# Patient Record
Sex: Male | Born: 1970
Health system: Southern US, Community
[De-identification: ages and names within clinical notes are randomized; demographics above are authoritative.]

## PROBLEM LIST (undated history)

## (undated) DIAGNOSIS — G459 Transient cerebral ischemic attack, unspecified: Secondary | ICD-10-CM

## (undated) DIAGNOSIS — E7800 Pure hypercholesterolemia, unspecified: Secondary | ICD-10-CM

## (undated) DIAGNOSIS — Z8669 Personal history of other diseases of the nervous system and sense organs: Secondary | ICD-10-CM

## (undated) DIAGNOSIS — Q2112 Patent foramen ovale: Secondary | ICD-10-CM

## (undated) DIAGNOSIS — Q211 Atrial septal defect: Secondary | ICD-10-CM

## (undated) HISTORY — DX: Personal history of other diseases of the nervous system and sense organs: Z86.69

## (undated) HISTORY — DX: Atrial septal defect: Q21.1

## (undated) HISTORY — DX: Patent foramen ovale: Q21.12

## (undated) HISTORY — DX: Pure hypercholesterolemia, unspecified: E78.00

## (undated) HISTORY — PX: OTHER SURGICAL HISTORY: SHX169

## (undated) HISTORY — DX: Transient cerebral ischemic attack, unspecified: G45.9

---

## 2002-01-27 ENCOUNTER — Encounter: Payer: Self-pay | Admitting: Emergency Medicine

## 2002-01-27 ENCOUNTER — Emergency Department (HOSPITAL_COMMUNITY): Admission: EM | Admit: 2002-01-27 | Discharge: 2002-01-27 | Payer: Self-pay | Admitting: Emergency Medicine

## 2009-08-22 DIAGNOSIS — G459 Transient cerebral ischemic attack, unspecified: Secondary | ICD-10-CM

## 2009-08-22 HISTORY — DX: Transient cerebral ischemic attack, unspecified: G45.9

## 2009-09-08 ENCOUNTER — Encounter (INDEPENDENT_AMBULATORY_CARE_PROVIDER_SITE_OTHER): Payer: Self-pay | Admitting: Internal Medicine

## 2009-09-08 ENCOUNTER — Inpatient Hospital Stay (HOSPITAL_COMMUNITY): Admission: EM | Admit: 2009-09-08 | Discharge: 2009-09-09 | Payer: Self-pay | Admitting: Emergency Medicine

## 2009-09-08 ENCOUNTER — Ambulatory Visit: Payer: Self-pay | Admitting: Vascular Surgery

## 2009-10-05 ENCOUNTER — Ambulatory Visit (HOSPITAL_COMMUNITY): Admission: RE | Admit: 2009-10-05 | Discharge: 2009-10-05 | Payer: Self-pay | Admitting: Interventional Cardiology

## 2009-10-05 ENCOUNTER — Encounter (INDEPENDENT_AMBULATORY_CARE_PROVIDER_SITE_OTHER): Payer: Self-pay | Admitting: Interventional Cardiology

## 2009-10-27 DIAGNOSIS — F329 Major depressive disorder, single episode, unspecified: Secondary | ICD-10-CM | POA: Insufficient documentation

## 2009-10-27 DIAGNOSIS — I2510 Atherosclerotic heart disease of native coronary artery without angina pectoris: Secondary | ICD-10-CM | POA: Insufficient documentation

## 2009-11-11 ENCOUNTER — Emergency Department (HOSPITAL_COMMUNITY): Admission: EM | Admit: 2009-11-11 | Discharge: 2009-11-11 | Payer: Self-pay | Admitting: Emergency Medicine

## 2009-11-21 ENCOUNTER — Ambulatory Visit: Payer: Self-pay | Admitting: Cardiovascular Disease

## 2009-11-21 DIAGNOSIS — Q211 Atrial septal defect: Secondary | ICD-10-CM | POA: Insufficient documentation

## 2009-11-25 ENCOUNTER — Telehealth: Payer: Self-pay | Admitting: Cardiovascular Disease

## 2009-12-13 ENCOUNTER — Encounter: Payer: Self-pay | Admitting: Cardiovascular Disease

## 2009-12-29 ENCOUNTER — Ambulatory Visit: Payer: Self-pay | Admitting: Cardiovascular Disease

## 2009-12-29 ENCOUNTER — Ambulatory Visit (HOSPITAL_COMMUNITY): Admission: RE | Admit: 2009-12-29 | Discharge: 2009-12-30 | Payer: Self-pay | Admitting: Cardiovascular Disease

## 2009-12-30 ENCOUNTER — Encounter: Payer: Self-pay | Admitting: Cardiovascular Disease

## 2010-02-02 ENCOUNTER — Encounter: Payer: Self-pay | Admitting: Cardiovascular Disease

## 2010-02-03 ENCOUNTER — Encounter: Payer: Self-pay | Admitting: Cardiovascular Disease

## 2010-02-03 ENCOUNTER — Ambulatory Visit: Payer: Self-pay | Admitting: Cardiovascular Disease

## 2010-02-03 ENCOUNTER — Ambulatory Visit: Payer: Self-pay

## 2010-02-03 ENCOUNTER — Ambulatory Visit (HOSPITAL_COMMUNITY): Admission: RE | Admit: 2010-02-03 | Discharge: 2010-02-03 | Payer: Self-pay | Admitting: Cardiovascular Disease

## 2010-04-19 ENCOUNTER — Encounter (INDEPENDENT_AMBULATORY_CARE_PROVIDER_SITE_OTHER): Payer: Self-pay | Admitting: *Deleted

## 2010-07-03 ENCOUNTER — Ambulatory Visit (HOSPITAL_COMMUNITY): Admission: RE | Admit: 2010-07-03 | Discharge: 2010-07-03 | Payer: Self-pay | Admitting: Cardiovascular Disease

## 2010-07-03 ENCOUNTER — Ambulatory Visit: Payer: Self-pay

## 2010-07-03 ENCOUNTER — Ambulatory Visit: Payer: Self-pay | Admitting: Cardiovascular Disease

## 2010-07-04 ENCOUNTER — Ambulatory Visit (HOSPITAL_BASED_OUTPATIENT_CLINIC_OR_DEPARTMENT_OTHER): Admission: RE | Admit: 2010-07-04 | Discharge: 2010-07-04 | Payer: Self-pay | Admitting: Otolaryngology

## 2010-07-08 ENCOUNTER — Ambulatory Visit: Payer: Self-pay | Admitting: Internal Medicine

## 2010-11-21 NOTE — Assessment & Plan Note (Signed)
Summary: rov   Visit Type:  follow up echo done today Primary Provider:  Duane Lope, MD  CC:  none.  History of Present Illness: 40 year-old male s/p transcatheter PFO closure March 2011 presents for followup evaluation today. He had an echo this afternoon showing normal LV function, no significant valvular disease, and normal position of his atrial septal occluder device. The patient feels well, remains active, and has no complaints. The patient denies chest pain, dyspnea, orthopnea, PND, edema, palpitations, lightheadedness, or syncope.   Current Medications (verified): 1)  Multivitamins  Tabs (Multiple Vitamin) .... Take 1 Tablet By Mouth Once A Day 2)  Plavix 75 Mg Tabs (Clopidogrel Bisulfate) .... Take One Tablet By Mouth Daily 3)  Crestor 20 Mg Tabs (Rosuvastatin Calcium) .... Take One Tablet By Mouth Daily. 4)  Niacin Cr 1000 Mg Cr-Tabs (Niacin) .... Take 1 Tablet By Mouth Once A Day 5)  Celexa 20 Mg Tabs (Citalopram Hydrobromide) .... Once Daily 6)  Aspirin 81 Mg Tbec (Aspirin) .... Take One Tablet By Mouth Daily  Allergies (verified): No Known Drug Allergies  Past History:  Past medical history reviewed for relevance to current acute and chronic problems.  Past Medical History: Current Problems:  TIA November 2010 with punctate cerebral infarct Migraine headaches Hypercholesterolemia PFO s/p transcatheter closure  Vital Signs:  Patient profile:   40 year old male Height:      72 inches Weight:      224 pounds BMI:     30.49 Pulse rate:   62 / minute Pulse rhythm:   regular Resp:     18 per minute BP sitting:   120 / 84  (left arm) Cuff size:   large  Vitals Entered By: Vikki Ports (July 03, 2010 4:09 PM)  Physical Exam  General:  Pt is alert and oriented, in no acute distress. HEENT: normal Neck: normal carotid upstrokes without bruits, JVP normal Lungs: CTA CV: RRR without murmur or gallop Abd: soft, NT, positive BS, no bruit, no  organomegaly Ext: no clubbing, cyanosis, or edema. peripheral pulses 2+ and equal Skin: warm and dry without rash    EKG  Procedure date:  07/03/2010  Findings:      NSR HR 62 bpm, WNL  Impression & Recommendations:  Problem # 1:  PATENT FORAMEN OVALE (ICD-745.5) Echo reviewed with patient and closure device is intact. He can discontinue plavix now that he is 6 months out from device placement. He also was instructed that he can discontinue SBE prophylaxis. Will f/u in one year with a 2D echo and followup office visit at that time.  Orders: EKG w/ Interpretation (93000)  Patient Instructions: 1)  Your physician recommends that you continue on your current medications as directed. Please refer to the Current Medication list given to you today. You may stop your Plavix when you finish your current bottle. 2)  Your physician wants you to follow-up in:  1 YEAR.  You will receive a reminder letter in the mail two months in advance. If you don't receive a letter, please call our office to schedule the follow-up appointment. 3)  Your physician has requested that you have an echocardiogram in 1 YEAR.  Echocardiography is a painless test that uses sound waves to create images of your heart. It provides your doctor with information about the size and shape of your heart and how well your heart's chambers and valves are working.  This procedure takes approximately one hour. There are no restrictions for this  procedure.

## 2010-11-21 NOTE — Progress Notes (Signed)
Summary: surgery  Phone Note Call from Patient Call back at Work Phone [phone]   Caller: Patient Reason for Call: Talk to Nurse Summary of Call: req call back form nurse, request not to do study would prefer to do surgery Initial call taken by: Migdalia Dk,  November 25, 2009 9:21 AM  Follow-up for Phone Call        The pt would like to try and have PFO closure the 1st or 2nd Week of March.  The pt is scheduled for a Bubble Study on 12/09/09 and he would like to know if he needs to proceed with this test.  I will ask Dr Excell Seltzer and call the pt on Monday. Julieta Gutting, RN, BSN  November 25, 2009 6:06 PM  Additional Follow-up for Phone Call Additional follow up Details #1::        I would go ahead and do the bubble study because we will repeat it post closure and it will be good to have a baseline for comparison. Additional Follow-up by: Norva Karvonen, MD,  November 29, 2009 5:36 PM

## 2010-11-21 NOTE — Miscellaneous (Signed)
Summary: Appointment Canceled  Appointment status changed to canceled by [employer] on 04/19/2010 2:34 PM.  Cancellation Comments --------------------- ECHO DX745.5/[employer] PENDING  Appointment Information ----------------------- Appt Type:  CARDIOLOGY ANCILLARY VISIT      Date:  Friday, July 07, 2010      Time:  2:00 PM for 60 min   Urgency:  Routine   Made By:  Pearson Grippe  To Visit:  [id number]    Reason:  ECHO DX745.5/[employer] PENDING  Appt Comments ------------- -- 04/19/10 14:34: ([facility]) CANCELED -- ECHO DX745.5/[employer] PENDING -- 02/03/10 15:30: ([facility]) BOOKED -- Routine CARDIOLOGY ANCILLARY VISIT at 07/07/2010 2:00 PM for 60 min ECHO DX745.5/[employer] PENDING                                                                                                   PT. RSC APPT. TO 07/03/10 @ 3:00

## 2010-11-21 NOTE — Miscellaneous (Signed)
Summary: Orders Update  Clinical Lists Changes  Orders: Added new Referral order of Echocardiogram (Echo) - Signed 

## 2010-11-21 NOTE — Assessment & Plan Note (Signed)
Summary: eph/jml   Visit Type:  Follow-up Primary Provider:  Duane Lope, MD  CC:  no complaints.  History of Present Illness: 40 year-old male presenting for followup after transcatheter PFO closure. He initially presented in November 2010 with left-sided numbness and tingling as well as transient left sided weakness. He underwent extensive evaluation with 2D echo, CT and MRI/MRA of the brain, and lab evaluatiion. The brain MRI showed a small infarction of the right parietal cortex. He underwent a TEE study in December 2010 demonstrating a PFO with right-to left shunt and mobile inter-atrial septum. He underwent transcatheter closure with an Amplatzer device in March 2011.  He denies chest pain, palpitations, dyspnea, or edema. He has had no neurologic symptoms. He denies headache.     Current Medications (verified): 1)  Multivitamins  Tabs (Multiple Vitamin) .... Take 1 Tablet By Mouth Once A Day 2)  Plavix 75 Mg Tabs (Clopidogrel Bisulfate) .... Take One Tablet By Mouth Daily 3)  Crestor 20 Mg Tabs (Rosuvastatin Calcium) .... Take One Tablet By Mouth Daily. 4)  Niacin Cr 1000 Mg Cr-Tabs (Niacin) .... Take 1 Tablet By Mouth Once A Day 5)  Celexa 20 Mg Tabs (Citalopram Hydrobromide) .... Once Daily 6)  Aspirin Ec 325 Mg Tbec (Aspirin) .... Take One Tablet By Mouth Daily  Allergies (verified): No Known Drug Allergies  Past History:  Past medical history reviewed for relevance to current acute and chronic problems.  Past Medical History: Current Problems:  TIA November 2010 with punctate cerebral infarct Migraine headaches Hypercholesterolemia PFO  Vital Signs:  Patient profile:   40 year old male Height:      72 inches Weight:      223 pounds BMI:     30.35 Pulse rate:   65 / minute BP sitting:   118 / 78  (left arm) Cuff size:   regular  Vitals Entered By: Hardin Negus, RMA (February 03, 2010 2:54 PM)  Physical Exam  General:  Pt is alert and oriented, in no acute  distress. HEENT: normal Neck: normal carotid upstrokes without bruits, JVP normal Lungs: CTA CV: RRR without murmur or gallop Abd: soft, NT, positive BS, no bruit, no organomegaly Ext: no clubbing, cyanosis, or edema. peripheral pulses 2+ and equal Skin: warm and dry without rash    Echocardiogram  Procedure date:  02/03/2010  Findings:      Left ventricle: The cavity size was normal. Wall thickness was       increased in a pattern of mild LVH. Systolic function was normal.       The estimated ejection fraction was in the range of 55% to 60%.       Features are consistent with a pseudonormal left ventricular       filling pattern, with concomitant abnormal relaxation and       increased filling pressure (grade 2 diastolic dysfunction).     - Aorta: Aortic root is minimally dilated at 40 mm.     - Mitral valve: Mild regurgitation.     - Left atrium: Poor acoustic windows. Difficult to see atrial       septum. Cannot confirm any residual PFO by this study. Would need       TEE.  Impression & Recommendations:  Problem # 1:  PATENT FORAMEN OVALE (ICD-745.5) The patient is doing well status post closure of his atrial septal communication. I reviewed his echo images today and it is difficult to visualize his atrial septum. Will repeat his echo  study in about 6 months to make sure there is no effusion or other abnormality at that time. Recommend that he continue dual antiplatelet therapy with aspirin and Plavix for a period of 6 months. He will maintain antiplatelet therapy with low-dose aspirin long term. We also discussed the need to use SBE prophylaxis for a total of 6 months as well.  Orders: Echocardiogram (Echo)  Patient Instructions: 1)  Your physician recommends that you schedule a follow-up appointment in: follow up visit in september and echocardiogram same day 2)  Your physician has requested that you have an echocardiogram.  Echocardiography is a painless test that uses  sound waves to create images of your heart. It provides your doctor with information about the size and shape of your heart and how well your heart's chambers and valves are working.  This procedure takes approximately one hour. There are no restrictions for this procedure.

## 2010-11-21 NOTE — Assessment & Plan Note (Signed)
Summary: PFO   Visit Type:  Initial Consult Primary Provider:  Duane Lope, MD  CC:  PFO.  History of Present Illness: 40 year-old male who initially presented in November 2010 with left-sided numbness and tingling as well as transient left sided weakness. He underwent extensive evaluation with 2D echo, CT and MRI/MRA of the brain, and lab evaluatiion. The brain MRI showed a small infarction of the right parietal cortex. He underwent a TEE study in December 2010 demonstrating a PFO with right-to left shunt and mobile inter-atrial septum. He is now referred for consideration of transcatheter closure.  The pt was evaluated in the Emergency Room again in January for chest pain and numbness, at which time a repeat brain MRI was negative. He was evaluated by Dr Anne Hahn at that time and discharged home after a short period of observation.  At present he feels well. He is having no chest pain, headache, palpitations, edema, or other complaints. He does complain of lightheadedness after walking.       Current Medications (verified): 1)  Multivitamins  Tabs (Multiple Vitamin) .... Take 1 Tablet By Mouth Once A Day 2)  Provigil 200 Mg Tabs (Modafinil) .... Take 1 Tablet By Mouth Once A Day 3)  Aggrenox 25-200 Mg Xr12h-Cap (Aspirin-Dipyridamole) .... Take 1 Capsule By Mouth Two Times A Day 4)  Crestor 20 Mg Tabs (Rosuvastatin Calcium) .... Take One Tablet By Mouth Daily. 5)  Niacin Cr 1000 Mg Cr-Tabs (Niacin) .... Take 1 Tablet By Mouth Once A Day  Allergies (verified): No Known Drug Allergies  Past History:  Past medical, surgical, family and social histories (including risk factors) reviewed, and no changes noted (except as noted below).  Past Medical History: Current Problems:  TIA November 2010 with punctate cerebral infarct Migraine headaches Hypercholesterolemia  Past Surgical History: VP shunt as a child, age 72 for concern of hydrocephalus causing headaches  Family  History: Reviewed history from 10/27/2009 and no changes required.  His brother had a stroke at age 13, diabetes poorly controlled Father had CABG at age 8, also had an MI.  Mother had PFO closure about 5 years ago  Social History: Reviewed history from 10/27/2009 and no changes required. He denies tobacco, he denies alcohol.  Married, works for [employer]  Review of Systems       History of migraine headaches, otherwise negative except as per HPI   Vital Signs:  Patient profile:   40 year old male Height:      72 inches Weight:      219 pounds BMI:     29.81 Pulse rate:   75 / minute Pulse rhythm:   regular Resp:     18 per minute BP sitting:   108 / 80  (left arm) Cuff size:   large  Vitals Entered By: Vikki Ports (November 21, 2009 9:49 AM)  Physical Exam  General:  Pt is well-developed, alert and oriented, age-appropriate male, in no acute distress HEENT: normal Neck: no thyromegaly           JVP normal, carotid upstrokes normal without bruits Lungs: CTA Chest: equal expansion  CV: Apical impulse nondisplaced, RRR without murmur or gallop Abd: soft, NT, positive BS, no HSM, no bruit Back: no CVA tenderness Ext: no clubbing, cyanosis, or edema        femoral pulses 2+ without bruits        pedal pulses 2+ and equal Skin: warm, dry, no rash Neuro: CNII-XII intact,strength 5/5 = b/l  EKG  Procedure date:  11/21/2009  Findings:      NSR, HR 75 bpm, within normal limits  Impression & Recommendations:  Problem # 1:  PATENT FORAMEN OVALE (ICD-745.5) I had a long discussion with the patient and his wife today regarding PFO and stroke. I reviewed the TEE images with the patient. Of note, his TIA occurred on a background of antiplatelet Rx with ASA. He is now on aggrenox with no intercurrent events. We discussed the lack of randomized controlled data for benefit of transcatheter closure, but that is frequently done for treatment of PFO and prevention of recurrent  TIA. I discussed the RESPECT PFO trial in detail with the patient, and he will meet with Dr Pearlean Brownie tomorrow for evaluation. In the meantime, I have recommended continuation of aggrenox and secondary risk reduction measures with aggressive cholesterol-lowering.   We reviewed risks, indications, and alternatives to transcatheter closure and discussed the details of the procedure. He will be in touch wiht me after he meets with Dr Pearlean Brownie and decides on whether to enroll in the Respect trial.   Orders: EKG w/ Interpretation (93000)  Patient Instructions: 1)  Your physician recommends that you schedule a follow-up appointment as needed.

## 2010-11-21 NOTE — Letter (Signed)
Summary: PFO Closure- Main Lab  [facility], Main Office  [address]   [place], [place] [address]   Phone: [phone]  Fax: [phone]     12/13/2009 MRN: [id number]  JAVON SNEE [address] [place], [place]  [address]  Dear Mr. HARTLAGE,   You are scheduled for a PFO Closure on Thursday December 29, 2009               with Dr. Excell Seltzer.  Please arrive at the [facility] at 5:30       a.m. on the day of your procedure.  1. DIET     _X__ Nothing to eat or drink after midnight except your medications with a sip of water.  2. Your last dose of Aggrenox will be on Monday (12/26/09).  Please start Plavix 300mg  and Aspirin 325mg  on (12/27/09).  On 12/28/09 you will decrease Plavix to 75mg  daily and continue Aspirin at 325mg  daily.  MAKE SURE YOU TAKE YOUR PLAVIX and ASPIRIN ON THE DAY OF YOUR PROCEDURE.  3. _X___ YOU MAY TAKE ALL of your remaining medications with a small amount of water.     4. Plan for one night stay - bring personal belongings (i.e. toothpaste, toothbrush, etc.)  5. Bring a current list of your medications and current insurance cards.  6. Must have a responsible person to drive you home.   7. Someone must be with you for the first 24 hours after you arrive home.  8. Please wear clothes that are easy to get on and off and wear slip-on shoes.  *Special note: Every effort is made to have your procedure done on time.  Occasionally there are emergencies that present themselves at the hospital that may cause delays.  Please be patient if a delay does occur.  If you have any questions after you get home, please call the office at the number listed above.  Julieta Gutting, RN, BSN

## 2011-01-07 LAB — CBC
Hemoglobin: 14.4 g/dL (ref 13.0–17.0)
RDW: 13.6 % (ref 11.5–15.5)
WBC: 4.8 10*3/uL (ref 4.0–10.5)

## 2011-01-07 LAB — BASIC METABOLIC PANEL
Calcium: 9.1 mg/dL (ref 8.4–10.5)
GFR calc Af Amer: >60 mL/min (ref >60)
GFR calc non Af Amer: >60 mL/min (ref >60)
Glucose, Bld: 106 mg/dL — ABNORMAL HIGH (ref 70–99)
Potassium: 4.1 mEq/L (ref 3.5–5.1)
Sodium: 137 mEq/L (ref 135–145)

## 2011-01-07 LAB — POCT CARDIAC MARKERS
CKMB, poc: <1 ng/mL — ABNORMAL LOW (ref 1.0–8.0)
Myoglobin, poc: 45.1 ng/mL (ref 12–200)

## 2011-01-07 LAB — DIFFERENTIAL
Basophils Absolute: 0 10*3/uL (ref 0.0–0.1)
Lymphocytes Relative: 21 % (ref 12–46)
Lymphs Abs: 1 10*3/uL (ref 0.7–4.0)
Monocytes Absolute: 0.3 10*3/uL (ref 0.1–1.0)
Neutro Abs: 3.3 10*3/uL (ref 1.7–7.7)

## 2011-01-07 LAB — PROTIME-INR: Prothrombin Time: 13 seconds (ref 11.6–15.2)

## 2011-01-14 LAB — PROTIME-INR
INR: 0.94 (ref 0.00–1.49)
Prothrombin Time: 12.5 seconds (ref 11.6–15.2)

## 2011-01-14 LAB — CBC
HCT: 43.3 % (ref 39.0–52.0)
Hemoglobin: 15.1 g/dL (ref 13.0–17.0)
MCHC: 34.8 g/dL (ref 30.0–36.0)
MCV: 90.5 fL (ref 78.0–100.0)
Platelets: 187 10*3/uL (ref 150–400)
RBC: 4.79 MIL/uL (ref 4.22–5.81)
RDW: 12.8 % (ref 11.5–15.5)
WBC: 5.1 10*3/uL (ref 4.0–10.5)

## 2011-01-14 LAB — BASIC METABOLIC PANEL
BUN: 10 mg/dL (ref 6–23)
CO2: 25 mEq/L (ref 19–32)
Calcium: 8.7 mg/dL (ref 8.4–10.5)
Chloride: 108 mEq/L (ref 96–112)
Creatinine, Ser: 0.73 mg/dL (ref 0.4–1.5)
GFR calc Af Amer: >60 mL/min (ref >60)
GFR calc non Af Amer: >60 mL/min (ref >60)
Glucose, Bld: 118 mg/dL — ABNORMAL HIGH (ref 70–99)
Potassium: 3.9 mEq/L (ref 3.5–5.1)
Sodium: 137 mEq/L (ref 135–145)

## 2011-01-14 LAB — APTT: aPTT: 31 seconds (ref 24–37)

## 2011-01-24 LAB — DIFFERENTIAL
Basophils Absolute: 0 10*3/uL (ref 0.0–0.1)
Basophils Relative: 0 % (ref 0–1)
Eosinophils Absolute: 0.1 10*3/uL (ref 0.0–0.7)
Eosinophils Absolute: 0.1 10*3/uL (ref 0.0–0.7)
Lymphs Abs: 1.1 10*3/uL (ref 0.7–4.0)
Monocytes Absolute: 0.7 10*3/uL (ref 0.1–1.0)
Monocytes Relative: 11 % (ref 3–12)
Monocytes Relative: 7 % (ref 3–12)
Neutrophils Relative %: 69 % (ref 43–77)
Neutrophils Relative %: 78 % — ABNORMAL HIGH (ref 43–77)

## 2011-01-24 LAB — COMPREHENSIVE METABOLIC PANEL
ALT: 34 U/L (ref 0–53)
AST: 34 U/L (ref 0–37)
Albumin: 4.1 g/dL (ref 3.5–5.2)
Calcium: 8.7 mg/dL (ref 8.4–10.5)
GFR calc Af Amer: >60 mL/min (ref >60)
Glucose, Bld: 102 mg/dL — ABNORMAL HIGH (ref 70–99)
Sodium: 137 mEq/L (ref 135–145)
Total Protein: 7 g/dL (ref 6.0–8.3)

## 2011-01-24 LAB — POCT CARDIAC MARKERS
CKMB, poc: <1 ng/mL — ABNORMAL LOW (ref 1.0–8.0)
Myoglobin, poc: 64.8 ng/mL (ref 12–200)
Myoglobin, poc: 84.3 ng/mL (ref 12–200)
Troponin i, poc: <0.05 ng/mL (ref 0.00–0.09)
Troponin i, poc: <0.05 ng/mL (ref 0.00–0.09)

## 2011-01-24 LAB — BASIC METABOLIC PANEL
BUN: 9 mg/dL (ref 6–23)
CO2: 27 mEq/L (ref 19–32)
Calcium: 8.9 mg/dL (ref 8.4–10.5)
Creatinine, Ser: 0.93 mg/dL (ref 0.4–1.5)
Glucose, Bld: 105 mg/dL — ABNORMAL HIGH (ref 70–99)

## 2011-01-24 LAB — CBC
MCHC: 34.8 g/dL (ref 30.0–36.0)
MCHC: 35 g/dL (ref 30.0–36.0)
MCV: 88.7 fL (ref 78.0–100.0)
Platelets: 269 10*3/uL (ref 150–400)
RBC: 4.85 MIL/uL (ref 4.22–5.81)
RDW: 12.4 % (ref 11.5–15.5)
RDW: 12.5 % (ref 11.5–15.5)

## 2011-01-24 LAB — POCT I-STAT, CHEM 8
BUN: 8 mg/dL (ref 6–23)
Calcium, Ion: 1.21 mmol/L (ref 1.12–1.32)
Chloride: 103 mEq/L (ref 96–112)

## 2011-01-24 LAB — PROTIME-INR: INR: 1 (ref 0.00–1.49)

## 2011-01-24 LAB — LIPID PANEL
LDL Cholesterol: 116 mg/dL — ABNORMAL HIGH (ref 0–99)
VLDL: 46 mg/dL — ABNORMAL HIGH (ref 0–40)

## 2011-01-24 LAB — RAPID URINE DRUG SCREEN, HOSP PERFORMED
Barbiturates: NOT DETECTED
Benzodiazepines: NOT DETECTED

## 2011-01-24 LAB — MAGNESIUM: Magnesium: 2.3 mg/dL (ref 1.5–2.5)

## 2011-01-24 LAB — HEMOGLOBIN A1C
Hgb A1c MFr Bld: 5.8 % (ref 4.6–6.1)
Mean Plasma Glucose: 120 mg/dL

## 2011-06-04 ENCOUNTER — Encounter: Payer: Self-pay | Admitting: Cardiovascular Disease

## 2011-06-28 ENCOUNTER — Encounter: Payer: Self-pay | Admitting: Cardiovascular Disease

## 2011-06-28 ENCOUNTER — Ambulatory Visit (INDEPENDENT_AMBULATORY_CARE_PROVIDER_SITE_OTHER): Payer: BC Managed Care – PPO | Admitting: Cardiovascular Disease

## 2011-06-28 VITALS — BP 122/86 | HR 68 | Ht 72.0 in | Wt 240.4 lb

## 2011-06-28 DIAGNOSIS — Q2111 Secundum atrial septal defect: Secondary | ICD-10-CM

## 2011-06-28 DIAGNOSIS — Q211 Atrial septal defect: Secondary | ICD-10-CM

## 2011-06-28 NOTE — Patient Instructions (Signed)

## 2011-07-02 ENCOUNTER — Encounter: Payer: Self-pay | Admitting: Cardiovascular Disease

## 2011-07-02 NOTE — Progress Notes (Signed)
HPI:  This is a 40 year old gentleman presenting for followup evaluation. He initially presented with a TIA in 2010 and was ultimately diagnosed with a patent foramen ovale with right-to-left intracardiac shunt. He underwent transcatheter closure in March 2011. The patient has had no subsequent problems. He denies chest pain, dyspnea, edema, orthopnea, or PND. He denies recurrent neurologic symptoms. He's had no headaches.  Outpatient Encounter Prescriptions as of 06/28/2011  Medication Sig Dispense Refill  . aspirin 81 MG tablet Take 81 mg by mouth daily.        . citalopram (CELEXA) 20 MG tablet Take 20 mg by mouth daily.        . Multiple Vitamin (MULTIVITAMIN) capsule Take 1 capsule by mouth daily.        . Niacin CR 1000 MG TBCR Take 1 tablet by mouth daily.        . rosuvastatin (CRESTOR) 20 MG tablet Take 20 mg by mouth daily.        Marland Kitchen DISCONTD: clopidogrel (PLAVIX) 75 MG tablet Take 75 mg by mouth daily.          No Known Allergies  Past Medical History  Diagnosis Date  . TIA (transient ischemic attack) 08/2009    with punctate cerebral infarct  . History of migraine headaches   . Hypercholesteremia   . PFO (patent foramen ovale)     s/p transcatheter closure    ROS: Negative except as per HPI  BP 122/86  Pulse 68  Ht 6' (1.829 m)  Wt 240 lb 6.4 oz (109.045 kg)  BMI 32.60 kg/m2  PHYSICAL EXAM: Pt is alert and oriented, overweight male in NAD HEENT: normal Neck: JVP - normal, carotids 2+= without bruits Lungs: CTA bilaterally CV: RRR without murmur or gallop Abd: soft, NT, Positive BS, no hepatomegaly Ext: no C/C/E, distal pulses intact and equal Skin: warm/dry no rash  EKG:  Normal sinus rhythm 68 beats per minute, within normal limits.  ASSESSMENT AND PLAN:

## 2011-07-03 ENCOUNTER — Ambulatory Visit (HOSPITAL_COMMUNITY): Payer: BC Managed Care – PPO | Attending: Cardiovascular Disease | Admitting: Radiology

## 2011-07-03 ENCOUNTER — Encounter: Payer: Self-pay | Admitting: *Deleted

## 2011-07-03 DIAGNOSIS — Q2111 Secundum atrial septal defect: Secondary | ICD-10-CM | POA: Insufficient documentation

## 2011-07-03 DIAGNOSIS — I079 Rheumatic tricuspid valve disease, unspecified: Secondary | ICD-10-CM | POA: Insufficient documentation

## 2011-07-03 DIAGNOSIS — E669 Obesity, unspecified: Secondary | ICD-10-CM | POA: Insufficient documentation

## 2011-07-03 DIAGNOSIS — I2510 Atherosclerotic heart disease of native coronary artery without angina pectoris: Secondary | ICD-10-CM | POA: Insufficient documentation

## 2011-07-03 DIAGNOSIS — Q211 Atrial septal defect: Secondary | ICD-10-CM

## 2011-07-03 DIAGNOSIS — I059 Rheumatic mitral valve disease, unspecified: Secondary | ICD-10-CM | POA: Insufficient documentation

## 2011-07-03 NOTE — Assessment & Plan Note (Signed)
The patient is stable from a cardiovascular perspective. He remains in sinus rhythm. He is due for a followup echocardiogram as it has been one year since his last echo study. We discussed the importance of primary risk reduction measures with lifestyle changes including focus on diet and exercise.

## 2012-07-21 ENCOUNTER — Ambulatory Visit: Payer: BC Managed Care – PPO | Admitting: Cardiovascular Disease

## 2012-08-22 ENCOUNTER — Encounter: Payer: Self-pay | Admitting: Cardiovascular Disease

## 2012-08-22 ENCOUNTER — Ambulatory Visit (INDEPENDENT_AMBULATORY_CARE_PROVIDER_SITE_OTHER): Payer: BC Managed Care – PPO | Admitting: Cardiovascular Disease

## 2012-08-22 VITALS — BP 130/84 | HR 92 | Ht 72.0 in | Wt 237.0 lb

## 2012-08-22 DIAGNOSIS — Q211 Atrial septal defect: Secondary | ICD-10-CM

## 2012-08-22 DIAGNOSIS — I2510 Atherosclerotic heart disease of native coronary artery without angina pectoris: Secondary | ICD-10-CM

## 2012-08-22 NOTE — Progress Notes (Signed)
   HPI:  41 year old gentleman presenting for followup evaluation. He underwent transcatheter closure of a patent foramen ovale in 2011. He initially presented in 2010 with a TIA. He's had no problems since that time. He specifically denies any stroke or TIA symptoms. He's had no chest pain or pressure, shortness of breath, or leg swelling. He does some exercise on the weekends but he's been busy at work and has not been able to sustain a regular exercise program. He has no complaints today.  Outpatient Encounter Prescriptions as of 08/22/2012  Medication Sig Dispense Refill  . aspirin 81 MG tablet Take 81 mg by mouth daily.        . citalopram (CELEXA) 20 MG tablet Take 20 mg by mouth daily.        . Multiple Vitamin (MULTIVITAMIN) capsule Take 1 capsule by mouth daily.        . Niacin CR 1000 MG TBCR Take 1 tablet by mouth daily.        Marland Kitchen NUVIGIL 250 MG tablet Take 1 tablet by mouth Daily.      . rosuvastatin (CRESTOR) 20 MG tablet Take 20 mg by mouth daily.          No Known Allergies  Past Medical History  Diagnosis Date  . TIA (transient ischemic attack) 08/2009    with punctate cerebral infarct  . History of migraine headaches   . Hypercholesteremia   . PFO (patent foramen ovale)     s/p transcatheter closure    ROS: Negative except as per HPI  BP 130/84  Pulse 92  Ht 6' (1.829 m)  Wt 107.502 kg (237 lb)  BMI 32.14 kg/m2  PHYSICAL EXAM: Pt is alert and oriented, NAD HEENT: normal Neck: JVP - normal, carotids 2+= without bruits Lungs: CTA bilaterally CV: RRR without murmur or gallop Abd: soft, NT, Positive BS, no hepatomegaly Ext: no C/C/E, distal pulses intact and equal Skin: warm/dry no rash  EKG:  Normal sinus rhythm 92 beats per minute, within normal limits.  ASSESSMENT AND PLAN: PFO status post transcatheter closure. The patient remained stable. His echocardiogram last year showed good device position and no other abnormalities. He should followup in 2 years. I  would recommend that he remain on long-term aspirin 81 mg. His lipids are followed by Dr. Tenny Craw. We discussed the importance of maintaining a regular exercise program and working on dietary modification. He will call if he develops any problems.  Tonny Bollman 08/22/2012 10:50 AM

## 2012-08-22 NOTE — Patient Instructions (Addendum)
Your physician wants you to follow-up in: 2 YEARS with Dr Cooper.  You will receive a reminder letter in the mail two months in advance. If you don't receive a letter, please call our office to schedule the follow-up appointment.  Your physician recommends that you continue on your current medications as directed. Please refer to the Current Medication list given to you today.  

## 2012-12-06 ENCOUNTER — Other Ambulatory Visit: Payer: Self-pay

## 2013-08-27 ENCOUNTER — Other Ambulatory Visit: Payer: Self-pay

## 2014-08-06 ENCOUNTER — Other Ambulatory Visit: Payer: Self-pay

## 2015-12-22 DIAGNOSIS — G4733 Obstructive sleep apnea (adult) (pediatric): Secondary | ICD-10-CM | POA: Diagnosis not present

## 2016-01-22 DIAGNOSIS — G4733 Obstructive sleep apnea (adult) (pediatric): Secondary | ICD-10-CM | POA: Diagnosis not present

## 2016-02-21 DIAGNOSIS — G4733 Obstructive sleep apnea (adult) (pediatric): Secondary | ICD-10-CM | POA: Diagnosis not present

## 2016-03-09 DIAGNOSIS — R350 Frequency of micturition: Secondary | ICD-10-CM | POA: Diagnosis not present

## 2016-03-09 DIAGNOSIS — R5381 Other malaise: Secondary | ICD-10-CM | POA: Diagnosis not present

## 2016-05-11 DIAGNOSIS — G4733 Obstructive sleep apnea (adult) (pediatric): Secondary | ICD-10-CM | POA: Diagnosis not present

## 2016-06-19 DIAGNOSIS — G4733 Obstructive sleep apnea (adult) (pediatric): Secondary | ICD-10-CM | POA: Diagnosis not present

## 2016-06-19 DIAGNOSIS — E782 Mixed hyperlipidemia: Secondary | ICD-10-CM | POA: Diagnosis not present

## 2016-07-03 DIAGNOSIS — F332 Major depressive disorder, recurrent severe without psychotic features: Secondary | ICD-10-CM | POA: Diagnosis not present

## 2017-01-24 DIAGNOSIS — F332 Major depressive disorder, recurrent severe without psychotic features: Secondary | ICD-10-CM | POA: Diagnosis not present

## 2017-04-16 DIAGNOSIS — N529 Male erectile dysfunction, unspecified: Secondary | ICD-10-CM | POA: Diagnosis not present

## 2017-04-16 DIAGNOSIS — R5381 Other malaise: Secondary | ICD-10-CM | POA: Diagnosis not present

## 2017-04-16 DIAGNOSIS — R6882 Decreased libido: Secondary | ICD-10-CM | POA: Diagnosis not present

## 2017-04-16 DIAGNOSIS — E782 Mixed hyperlipidemia: Secondary | ICD-10-CM | POA: Diagnosis not present

## 2017-04-23 DIAGNOSIS — E291 Testicular hypofunction: Secondary | ICD-10-CM | POA: Diagnosis not present

## 2017-04-23 DIAGNOSIS — R7989 Other specified abnormal findings of blood chemistry: Secondary | ICD-10-CM | POA: Diagnosis not present

## 2017-08-06 DIAGNOSIS — F332 Major depressive disorder, recurrent severe without psychotic features: Secondary | ICD-10-CM | POA: Diagnosis not present

## 2017-10-24 DIAGNOSIS — Z79899 Other long term (current) drug therapy: Secondary | ICD-10-CM | POA: Diagnosis not present

## 2017-10-24 DIAGNOSIS — E291 Testicular hypofunction: Secondary | ICD-10-CM | POA: Diagnosis not present

## 2018-01-28 DIAGNOSIS — F332 Major depressive disorder, recurrent severe without psychotic features: Secondary | ICD-10-CM | POA: Diagnosis not present

## 2018-04-22 DIAGNOSIS — M545 Low back pain: Secondary | ICD-10-CM | POA: Diagnosis not present

## 2018-04-22 DIAGNOSIS — M5416 Radiculopathy, lumbar region: Secondary | ICD-10-CM | POA: Diagnosis not present

## 2018-05-07 DIAGNOSIS — Z9889 Other specified postprocedural states: Secondary | ICD-10-CM | POA: Diagnosis not present

## 2018-05-07 DIAGNOSIS — M545 Low back pain: Secondary | ICD-10-CM | POA: Diagnosis not present

## 2018-05-07 DIAGNOSIS — M5416 Radiculopathy, lumbar region: Secondary | ICD-10-CM | POA: Diagnosis not present

## 2018-05-07 DIAGNOSIS — M6281 Muscle weakness (generalized): Secondary | ICD-10-CM | POA: Diagnosis not present

## 2018-05-13 DIAGNOSIS — N529 Male erectile dysfunction, unspecified: Secondary | ICD-10-CM | POA: Diagnosis not present

## 2018-05-13 DIAGNOSIS — E349 Endocrine disorder, unspecified: Secondary | ICD-10-CM | POA: Diagnosis not present

## 2018-05-13 DIAGNOSIS — Z0000 Encounter for general adult medical examination without abnormal findings: Secondary | ICD-10-CM | POA: Diagnosis not present

## 2018-05-13 DIAGNOSIS — E782 Mixed hyperlipidemia: Secondary | ICD-10-CM | POA: Diagnosis not present

## 2018-05-15 DIAGNOSIS — M5416 Radiculopathy, lumbar region: Secondary | ICD-10-CM | POA: Diagnosis not present

## 2018-05-15 DIAGNOSIS — M6281 Muscle weakness (generalized): Secondary | ICD-10-CM | POA: Diagnosis not present

## 2018-05-15 DIAGNOSIS — M545 Low back pain: Secondary | ICD-10-CM | POA: Diagnosis not present

## 2018-05-15 DIAGNOSIS — Z9889 Other specified postprocedural states: Secondary | ICD-10-CM | POA: Diagnosis not present

## 2018-05-21 DIAGNOSIS — M6281 Muscle weakness (generalized): Secondary | ICD-10-CM | POA: Diagnosis not present

## 2018-05-21 DIAGNOSIS — M5416 Radiculopathy, lumbar region: Secondary | ICD-10-CM | POA: Diagnosis not present

## 2018-05-21 DIAGNOSIS — M545 Low back pain: Secondary | ICD-10-CM | POA: Diagnosis not present

## 2018-05-21 DIAGNOSIS — Z9889 Other specified postprocedural states: Secondary | ICD-10-CM | POA: Diagnosis not present

## 2018-05-29 DIAGNOSIS — M5416 Radiculopathy, lumbar region: Secondary | ICD-10-CM | POA: Diagnosis not present

## 2018-06-03 DIAGNOSIS — M5136 Other intervertebral disc degeneration, lumbar region: Secondary | ICD-10-CM | POA: Diagnosis not present

## 2018-06-03 DIAGNOSIS — M5416 Radiculopathy, lumbar region: Secondary | ICD-10-CM | POA: Diagnosis not present

## 2018-06-03 DIAGNOSIS — Z9889 Other specified postprocedural states: Secondary | ICD-10-CM | POA: Diagnosis not present

## 2018-06-09 DIAGNOSIS — M5416 Radiculopathy, lumbar region: Secondary | ICD-10-CM | POA: Diagnosis not present

## 2018-06-12 DIAGNOSIS — M5416 Radiculopathy, lumbar region: Secondary | ICD-10-CM | POA: Diagnosis not present

## 2018-06-12 DIAGNOSIS — Z9889 Other specified postprocedural states: Secondary | ICD-10-CM | POA: Diagnosis not present

## 2018-06-12 DIAGNOSIS — M5136 Other intervertebral disc degeneration, lumbar region: Secondary | ICD-10-CM | POA: Diagnosis not present

## 2018-06-12 DIAGNOSIS — M4858XA Collapsed vertebra, not elsewhere classified, sacral and sacrococcygeal region, initial encounter for fracture: Secondary | ICD-10-CM | POA: Diagnosis not present

## 2018-06-12 DIAGNOSIS — M5117 Intervertebral disc disorders with radiculopathy, lumbosacral region: Secondary | ICD-10-CM | POA: Diagnosis not present

## 2018-06-19 DIAGNOSIS — M5416 Radiculopathy, lumbar region: Secondary | ICD-10-CM | POA: Diagnosis not present

## 2018-06-24 DIAGNOSIS — M545 Low back pain: Secondary | ICD-10-CM | POA: Diagnosis not present

## 2018-06-24 DIAGNOSIS — M5126 Other intervertebral disc displacement, lumbar region: Secondary | ICD-10-CM | POA: Diagnosis not present

## 2018-06-24 DIAGNOSIS — M5416 Radiculopathy, lumbar region: Secondary | ICD-10-CM | POA: Diagnosis not present

## 2018-06-26 DIAGNOSIS — M5126 Other intervertebral disc displacement, lumbar region: Secondary | ICD-10-CM | POA: Diagnosis not present

## 2018-06-26 DIAGNOSIS — M5416 Radiculopathy, lumbar region: Secondary | ICD-10-CM | POA: Diagnosis not present

## 2018-07-11 DIAGNOSIS — M5416 Radiculopathy, lumbar region: Secondary | ICD-10-CM | POA: Diagnosis not present

## 2018-07-11 DIAGNOSIS — M5126 Other intervertebral disc displacement, lumbar region: Secondary | ICD-10-CM | POA: Diagnosis not present

## 2018-07-11 DIAGNOSIS — Z9889 Other specified postprocedural states: Secondary | ICD-10-CM | POA: Diagnosis not present

## 2018-07-22 DIAGNOSIS — F332 Major depressive disorder, recurrent severe without psychotic features: Secondary | ICD-10-CM | POA: Diagnosis not present

## 2018-08-11 DIAGNOSIS — M5416 Radiculopathy, lumbar region: Secondary | ICD-10-CM | POA: Diagnosis not present

## 2018-08-11 DIAGNOSIS — M5126 Other intervertebral disc displacement, lumbar region: Secondary | ICD-10-CM | POA: Diagnosis not present

## 2018-08-12 DIAGNOSIS — Z23 Encounter for immunization: Secondary | ICD-10-CM | POA: Diagnosis not present

## 2018-08-21 DIAGNOSIS — Z01812 Encounter for preprocedural laboratory examination: Secondary | ICD-10-CM | POA: Diagnosis not present

## 2018-08-21 DIAGNOSIS — G4730 Sleep apnea, unspecified: Secondary | ICD-10-CM | POA: Diagnosis not present

## 2018-08-21 DIAGNOSIS — M5416 Radiculopathy, lumbar region: Secondary | ICD-10-CM | POA: Diagnosis not present

## 2018-08-21 DIAGNOSIS — Z01818 Encounter for other preprocedural examination: Secondary | ICD-10-CM | POA: Diagnosis not present

## 2018-08-21 DIAGNOSIS — M5126 Other intervertebral disc displacement, lumbar region: Secondary | ICD-10-CM | POA: Diagnosis not present

## 2018-08-21 DIAGNOSIS — Z01810 Encounter for preprocedural cardiovascular examination: Secondary | ICD-10-CM | POA: Diagnosis not present

## 2018-08-29 DIAGNOSIS — R0789 Other chest pain: Secondary | ICD-10-CM | POA: Diagnosis not present

## 2018-08-29 DIAGNOSIS — R079 Chest pain, unspecified: Secondary | ICD-10-CM | POA: Diagnosis not present

## 2018-08-29 DIAGNOSIS — Z8673 Personal history of transient ischemic attack (TIA), and cerebral infarction without residual deficits: Secondary | ICD-10-CM | POA: Diagnosis not present

## 2018-08-29 DIAGNOSIS — G4733 Obstructive sleep apnea (adult) (pediatric): Secondary | ICD-10-CM | POA: Diagnosis not present

## 2018-08-29 DIAGNOSIS — Z79899 Other long term (current) drug therapy: Secondary | ICD-10-CM | POA: Diagnosis not present

## 2018-08-29 DIAGNOSIS — E785 Hyperlipidemia, unspecified: Secondary | ICD-10-CM | POA: Diagnosis not present

## 2018-08-29 DIAGNOSIS — R7989 Other specified abnormal findings of blood chemistry: Secondary | ICD-10-CM | POA: Diagnosis not present

## 2018-08-29 DIAGNOSIS — R0989 Other specified symptoms and signs involving the circulatory and respiratory systems: Secondary | ICD-10-CM | POA: Diagnosis not present

## 2018-08-29 DIAGNOSIS — J9811 Atelectasis: Secondary | ICD-10-CM | POA: Diagnosis not present

## 2018-08-29 DIAGNOSIS — Z7982 Long term (current) use of aspirin: Secondary | ICD-10-CM | POA: Diagnosis not present

## 2018-08-29 DIAGNOSIS — R918 Other nonspecific abnormal finding of lung field: Secondary | ICD-10-CM | POA: Diagnosis not present

## 2018-08-29 MED ORDER — NITROGLYCERIN 0.4 MG SL SUBL
.40 | SUBLINGUAL_TABLET | SUBLINGUAL | Status: DC
Start: ? — End: 2018-08-29

## 2018-09-03 DIAGNOSIS — M961 Postlaminectomy syndrome, not elsewhere classified: Secondary | ICD-10-CM | POA: Diagnosis not present

## 2018-09-03 DIAGNOSIS — G4733 Obstructive sleep apnea (adult) (pediatric): Secondary | ICD-10-CM | POA: Diagnosis not present

## 2018-09-03 DIAGNOSIS — E7849 Other hyperlipidemia: Secondary | ICD-10-CM | POA: Diagnosis not present

## 2018-09-03 DIAGNOSIS — M5416 Radiculopathy, lumbar region: Secondary | ICD-10-CM | POA: Diagnosis not present

## 2018-09-03 DIAGNOSIS — Z79899 Other long term (current) drug therapy: Secondary | ICD-10-CM | POA: Diagnosis not present

## 2018-09-03 DIAGNOSIS — E669 Obesity, unspecified: Secondary | ICD-10-CM | POA: Diagnosis not present

## 2018-09-03 DIAGNOSIS — Z8673 Personal history of transient ischemic attack (TIA), and cerebral infarction without residual deficits: Secondary | ICD-10-CM | POA: Diagnosis not present

## 2018-09-03 DIAGNOSIS — M5117 Intervertebral disc disorders with radiculopathy, lumbosacral region: Secondary | ICD-10-CM | POA: Diagnosis not present

## 2018-09-03 DIAGNOSIS — M5126 Other intervertebral disc displacement, lumbar region: Secondary | ICD-10-CM | POA: Diagnosis not present

## 2018-09-03 DIAGNOSIS — F329 Major depressive disorder, single episode, unspecified: Secondary | ICD-10-CM | POA: Diagnosis not present

## 2018-09-03 DIAGNOSIS — Z7982 Long term (current) use of aspirin: Secondary | ICD-10-CM | POA: Diagnosis not present

## 2018-12-28 ENCOUNTER — Emergency Department (INDEPENDENT_AMBULATORY_CARE_PROVIDER_SITE_OTHER): Payer: BLUE CROSS/BLUE SHIELD

## 2018-12-28 ENCOUNTER — Other Ambulatory Visit: Payer: Self-pay

## 2018-12-28 ENCOUNTER — Encounter: Payer: Self-pay | Admitting: Emergency Medicine

## 2018-12-28 ENCOUNTER — Emergency Department (INDEPENDENT_AMBULATORY_CARE_PROVIDER_SITE_OTHER)
Admission: EM | Admit: 2018-12-28 | Discharge: 2018-12-28 | Disposition: A | Discharge status: Home / Self Care | Payer: BLUE CROSS/BLUE SHIELD | Source: Home / Self Care

## 2018-12-28 DIAGNOSIS — M25572 Pain in left ankle and joints of left foot: Secondary | ICD-10-CM | POA: Diagnosis not present

## 2018-12-28 DIAGNOSIS — M7989 Other specified soft tissue disorders: Secondary | ICD-10-CM | POA: Diagnosis not present

## 2018-12-28 DIAGNOSIS — S99912A Unspecified injury of left ankle, initial encounter: Secondary | ICD-10-CM | POA: Diagnosis not present

## 2018-12-28 DIAGNOSIS — S93402A Sprain of unspecified ligament of left ankle, initial encounter: Secondary | ICD-10-CM | POA: Diagnosis not present

## 2018-12-28 NOTE — ED Triage Notes (Signed)
Here with left foot ankle swelling after falling off ladder on yesterday around noon. Reports whole body landed on foot/rolled. Tried RICE/Ibuprofen.

## 2018-12-28 NOTE — ED Provider Notes (Signed)
[facility]    CSN: [id number] Arrival date & time: 12/28/18  1541     History   Chief Complaint Chief Complaint  Patient presents with  . Ankle Pain    HPI Corey Burns is a 48 y.o. male.   HPI  Corey Burns is a 48 y.o. male presenting to UC with c/o Left ankle pain and soreness after falling off a ladder yesterday. His feet ear on the 3rd rung from the ground. His left ankle did roll when he landed. Pain is worse with ambulation and certain movements. He has tried rest, ice, compression and elevation with mild to moderate relief. No other injuries from the fall. No prior fracture to his Left ankle or foot.    Past Medical History:  Diagnosis Date  . History of migraine headaches   . Hypercholesteremia   . PFO (patent foramen ovale)    s/p transcatheter closure  . TIA (transient ischemic attack) 08/2009   with punctate cerebral infarct    Patient Active Problem List   Diagnosis Date Noted  . PATENT FORAMEN OVALE 11/21/2009  . DEPRESSION, CHRONIC 10/27/2009  . CAD 10/27/2009    Past Surgical History:  Procedure Laterality Date  . VP shunt as a child     age 87 for concern of hydrocephalus causing headaches       Home Medications    Prior to Admission medications   Medication Sig Start Date End Date Taking? Authorizing Provider  aspirin 81 MG tablet Take 81 mg by mouth daily.      [provider]  citalopram (CELEXA) 20 MG tablet Take 20 mg by mouth daily.      [provider]  Multiple Vitamin (MULTIVITAMIN) capsule Take 1 capsule by mouth daily.      [provider]  Niacin CR 1000 MG TBCR Take 1 tablet by mouth daily.      [provider]  NUVIGIL 250 MG tablet Take 1 tablet by mouth Daily. 08/11/12   [provider]  rosuvastatin (CRESTOR) 20 MG tablet Take 20 mg by mouth daily.      [provider]    Family History Family History  Problem Relation Age of Onset  . Stroke Brother     . Diabetes Brother   . Heart disease Father        had CABG  . Heart attack Father     Social History Social History   Tobacco Use  . Smoking status: Never Smoker  Substance Use Topics  . Alcohol use: No  . Drug use: Not on file     Allergies   Patient has no known allergies.   Review of Systems Review of Systems  Musculoskeletal: Positive for arthralgias. Negative for joint swelling.  Skin: Negative for color change and wound.  Neurological: Negative for weakness and numbness.     Physical Exam Triage Vital Signs ED Triage Vitals [12/28/18 1556]  Enc Vitals Group     BP (!) 168/81     Pulse Rate 83     Resp      Temp 98.1 F (36.7 C)     Temp Source Oral     SpO2 96 %     Weight 217 lb (98.4 kg)     Height 6' (1.829 m)     Head Circumference      Peak Flow      Pain Score 5     Pain Loc  Pain Edu?      Excl. in GC?    No data found.  Updated Vital Signs BP (!) 168/81 (BP Location: Left Arm)   Pulse 83   Temp 98.1 F (36.7 C) (Oral)   Ht 6' (1.829 m)   Wt 217 lb (98.4 kg)   SpO2 96%   BMI 29.43 kg/m   Visual Acuity Right Eye Distance:   Left Eye Distance:   Bilateral Distance:    Right Eye Near:   Left Eye Near:    Bilateral Near:     Physical Exam Vitals signs and nursing note reviewed.  Constitutional:      Appearance: He is well-developed.  HENT:     Head: Normocephalic and atraumatic.  Neck:     Musculoskeletal: Normal range of motion.  Cardiovascular:     Rate and Rhythm: Normal rate.     Pulses:          Dorsalis pedis pulses are 2+ on the left side.       Posterior tibial pulses are 2+ on the left side.  Pulmonary:     Effort: Pulmonary effort is normal.  Musculoskeletal: Normal range of motion.        General: Tenderness present. No swelling.     Comments: Left ankle and foot: no edema or deformity. Mild diffuse tenderness of ankle. Full ROM, increased pain on end range.  No tenderness into his foot Calf is soft,  non-tender.   Skin:    General: Skin is warm and dry.     Capillary Refill: Capillary refill takes less than 2 seconds.     Findings: No bruising or erythema.  Neurological:     Mental Status: He is alert and oriented to person, place, and time.  Psychiatric:        Behavior: Behavior normal.      UC Treatments / Results  Labs (all labs ordered are listed, but only abnormal results are displayed) Labs Reviewed - No data to display  EKG None  Radiology Dg Ankle Complete Left  Result Date: 12/28/2018 CLINICAL DATA:  Pain after fall yesterday. EXAM: LEFT ANKLE COMPLETE - 3+ VIEW COMPARISON:  None. FINDINGS: Mild lateral soft tissue swelling. No fractures or dislocations identified. IMPRESSION: Negative. Electronically Signed   By: Gerome Sam III M.D   On: 12/28/2018 16:29    Procedures Procedures (including critical care time)  Medications Ordered in UC Medications - No data to display  Initial Impression / Assessment and Plan / UC Course  I have reviewed the triage vital signs and the nursing notes.  Pertinent labs & imaging results that were available during my care of the patient were reviewed by me and considered in my medical decision making (see chart for details).     Reviewed imaging with pt Will tx as mild sprain Pt has ankle splints at home, declined one here. Pt has a cane from back surgery in November 2019. He denies back pain or other injuries today encouraged f/u with PCP or orthopedist as needed  Final Clinical Impressions(s) / UC Diagnoses   Final diagnoses:  Sprain of left ankle, unspecified ligament, initial encounter     Discharge Instructions      You may use an over the counter ankle splint to help with discomfort the first few days. As pain improves, you may try the home exercises in this packet to help strengthen your ankle again.  Please see your family doctor or an orthopedist in 1-2 weeks  if not improving.     ED Prescriptions      None     Controlled Substance Prescriptions [place] Controlled Substance Registry consulted? Not Applicable   Rolla Plate 12/28/18 1639

## 2018-12-28 NOTE — Discharge Instructions (Signed)
°  You may use an over the counter ankle splint to help with discomfort the first few days. As pain improves, you may try the home exercises in this packet to help strengthen your ankle again.  Please see your family doctor or an orthopedist in 1-2 weeks if not improving.

## 2019-01-21 DIAGNOSIS — F332 Major depressive disorder, recurrent severe without psychotic features: Secondary | ICD-10-CM | POA: Diagnosis not present

## 2019-05-18 DIAGNOSIS — E782 Mixed hyperlipidemia: Secondary | ICD-10-CM | POA: Diagnosis not present

## 2019-05-18 DIAGNOSIS — R7989 Other specified abnormal findings of blood chemistry: Secondary | ICD-10-CM | POA: Diagnosis not present

## 2019-08-17 DIAGNOSIS — F332 Major depressive disorder, recurrent severe without psychotic features: Secondary | ICD-10-CM | POA: Diagnosis not present

## 2019-11-27 IMAGING — DX DG ANKLE COMPLETE 3+V*L*
3 series · 3 of 3 positions shown · non-contrast
Comparison: None.

CLINICAL DATA: Pain after fall yesterday.

EXAM:
LEFT ANKLE COMPLETE - 3+ VIEW

[ankle ap]
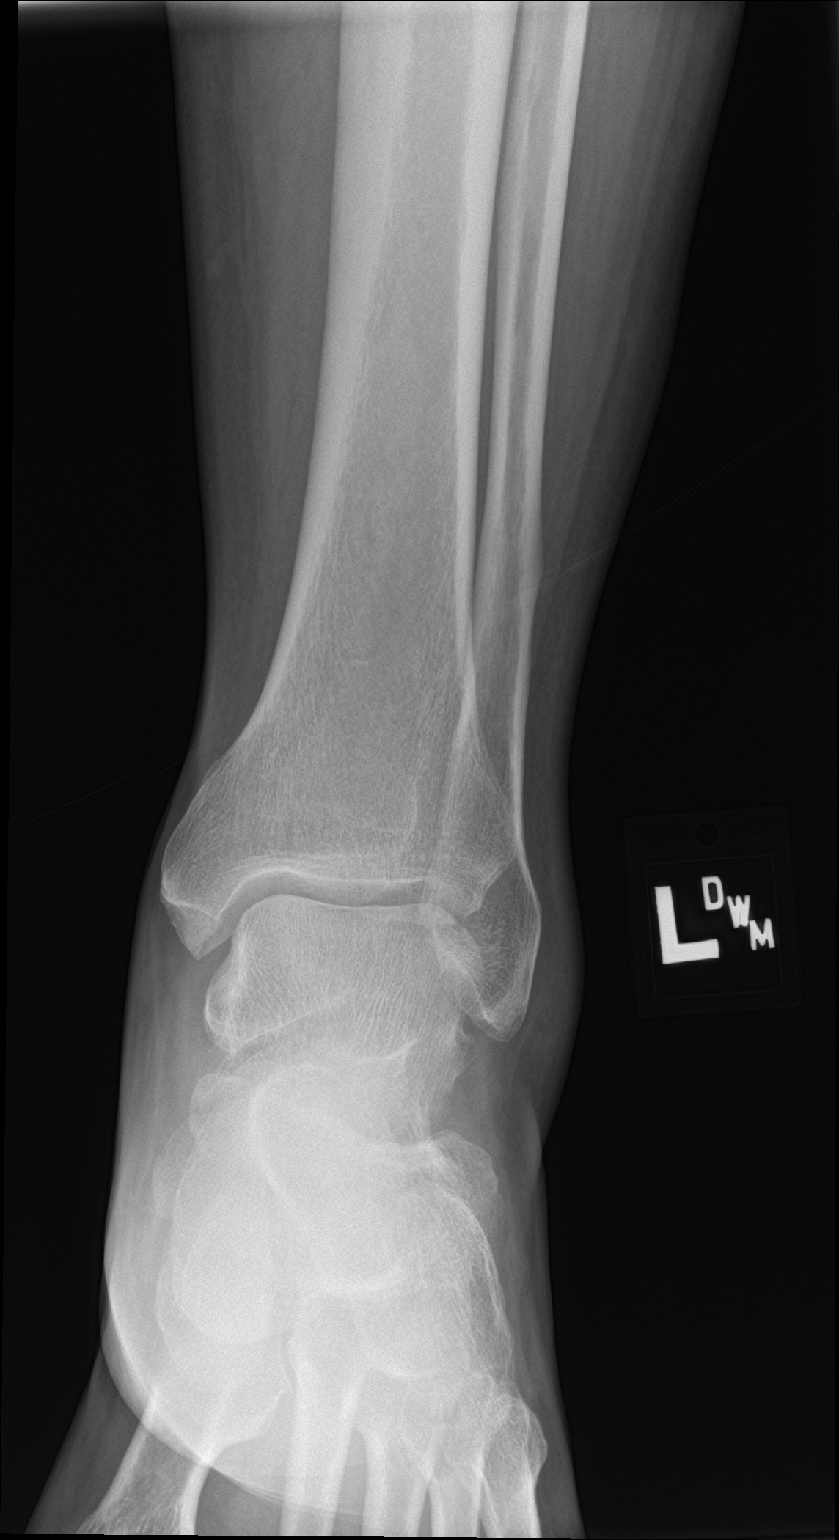

[ankle lat]
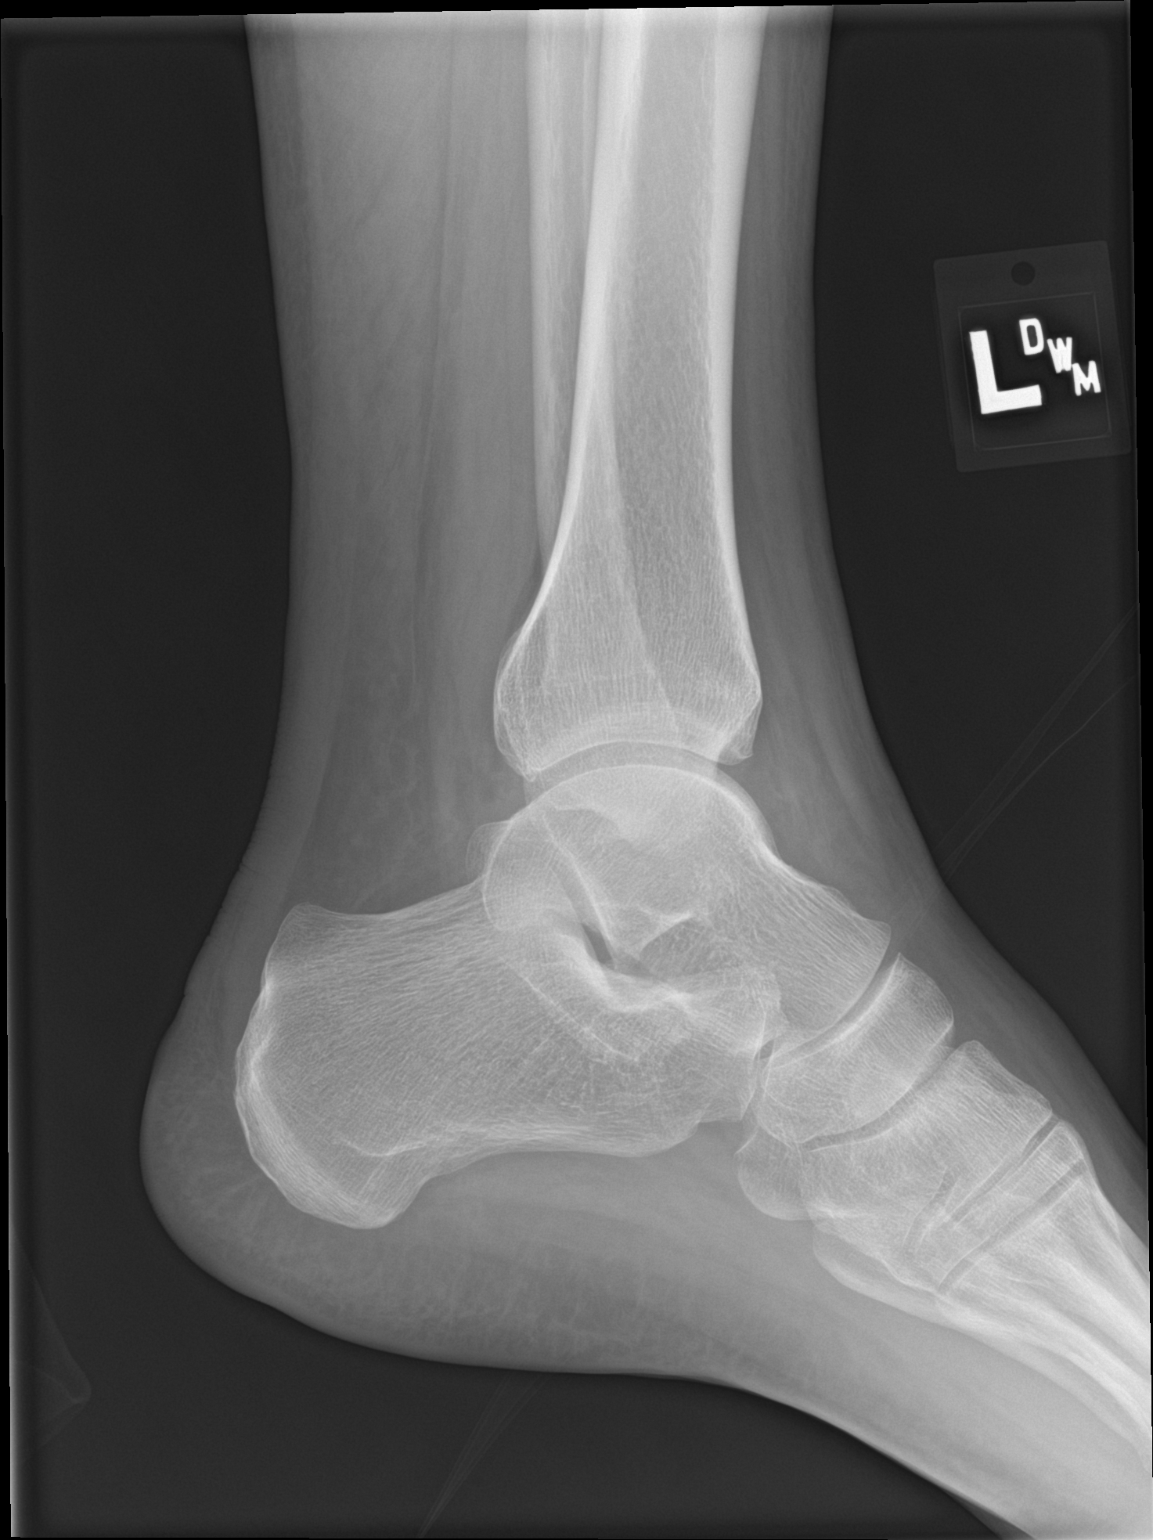

[ankle obl]
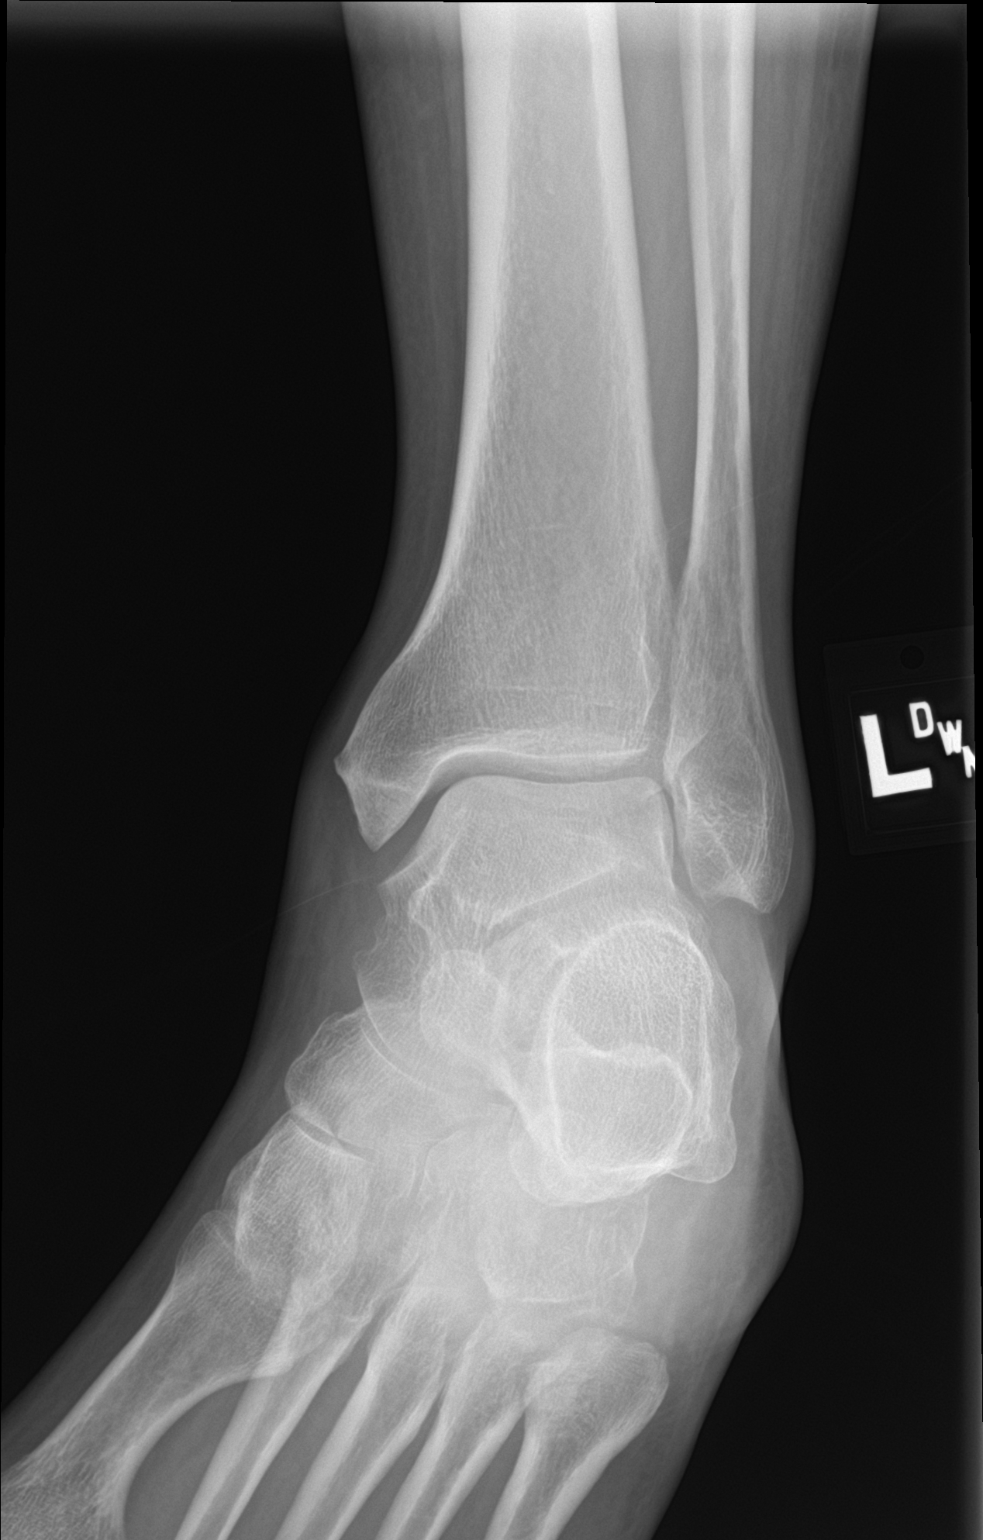

[3 of 3 positions shown; findings below may reference images not displayed]

FINDINGS: Mild lateral soft tissue swelling. No fractures or dislocations
identified.
IMPRESSION: Negative.

## 2020-02-09 DIAGNOSIS — F332 Major depressive disorder, recurrent severe without psychotic features: Secondary | ICD-10-CM | POA: Diagnosis not present

## 2020-05-23 DIAGNOSIS — E291 Testicular hypofunction: Secondary | ICD-10-CM | POA: Diagnosis not present

## 2020-05-23 DIAGNOSIS — R7989 Other specified abnormal findings of blood chemistry: Secondary | ICD-10-CM | POA: Diagnosis not present

## 2020-05-23 DIAGNOSIS — E782 Mixed hyperlipidemia: Secondary | ICD-10-CM | POA: Diagnosis not present

## 2020-05-23 DIAGNOSIS — Z79899 Other long term (current) drug therapy: Secondary | ICD-10-CM | POA: Diagnosis not present

## 2020-05-23 DIAGNOSIS — Z0000 Encounter for general adult medical examination without abnormal findings: Secondary | ICD-10-CM | POA: Diagnosis not present

## 2020-07-12 DIAGNOSIS — F332 Major depressive disorder, recurrent severe without psychotic features: Secondary | ICD-10-CM | POA: Diagnosis not present

## 2020-09-07 DIAGNOSIS — L309 Dermatitis, unspecified: Secondary | ICD-10-CM | POA: Diagnosis not present

## 2020-09-08 DIAGNOSIS — L299 Pruritus, unspecified: Secondary | ICD-10-CM | POA: Diagnosis not present

## 2020-09-08 DIAGNOSIS — L300 Nummular dermatitis: Secondary | ICD-10-CM | POA: Diagnosis not present

## 2021-01-03 DIAGNOSIS — F332 Major depressive disorder, recurrent severe without psychotic features: Secondary | ICD-10-CM | POA: Diagnosis not present

## 2021-01-18 DIAGNOSIS — K648 Other hemorrhoids: Secondary | ICD-10-CM | POA: Diagnosis not present

## 2021-01-18 DIAGNOSIS — Z1211 Encounter for screening for malignant neoplasm of colon: Secondary | ICD-10-CM | POA: Diagnosis not present

## 2021-05-26 DIAGNOSIS — Z0000 Encounter for general adult medical examination without abnormal findings: Secondary | ICD-10-CM | POA: Diagnosis not present

## 2021-05-26 DIAGNOSIS — Z125 Encounter for screening for malignant neoplasm of prostate: Secondary | ICD-10-CM | POA: Diagnosis not present

## 2021-05-26 DIAGNOSIS — E291 Testicular hypofunction: Secondary | ICD-10-CM | POA: Diagnosis not present

## 2021-05-26 DIAGNOSIS — E782 Mixed hyperlipidemia: Secondary | ICD-10-CM | POA: Diagnosis not present

## 2021-06-19 DIAGNOSIS — R030 Elevated blood-pressure reading, without diagnosis of hypertension: Secondary | ICD-10-CM | POA: Diagnosis not present

## 2021-06-19 DIAGNOSIS — R635 Abnormal weight gain: Secondary | ICD-10-CM | POA: Diagnosis not present

## 2021-06-19 DIAGNOSIS — Z23 Encounter for immunization: Secondary | ICD-10-CM | POA: Diagnosis not present

## 2021-06-19 DIAGNOSIS — E291 Testicular hypofunction: Secondary | ICD-10-CM | POA: Diagnosis not present

## 2021-06-19 DIAGNOSIS — E782 Mixed hyperlipidemia: Secondary | ICD-10-CM | POA: Diagnosis not present

## 2021-06-19 DIAGNOSIS — Z0000 Encounter for general adult medical examination without abnormal findings: Secondary | ICD-10-CM | POA: Diagnosis not present

## 2021-07-04 DIAGNOSIS — F332 Major depressive disorder, recurrent severe without psychotic features: Secondary | ICD-10-CM | POA: Diagnosis not present

## 2021-08-14 DIAGNOSIS — I10 Essential (primary) hypertension: Secondary | ICD-10-CM | POA: Diagnosis not present

## 2021-08-29 DIAGNOSIS — F332 Major depressive disorder, recurrent severe without psychotic features: Secondary | ICD-10-CM | POA: Diagnosis not present

## 2021-09-19 DIAGNOSIS — Z23 Encounter for immunization: Secondary | ICD-10-CM | POA: Diagnosis not present

## 2021-09-21 DIAGNOSIS — G4733 Obstructive sleep apnea (adult) (pediatric): Secondary | ICD-10-CM | POA: Diagnosis not present

## 2021-09-26 DIAGNOSIS — K045 Chronic apical periodontitis: Secondary | ICD-10-CM | POA: Diagnosis not present

## 2021-10-22 DIAGNOSIS — G4733 Obstructive sleep apnea (adult) (pediatric): Secondary | ICD-10-CM | POA: Diagnosis not present

## 2021-11-22 DIAGNOSIS — G4733 Obstructive sleep apnea (adult) (pediatric): Secondary | ICD-10-CM | POA: Diagnosis not present

## 2021-12-26 DIAGNOSIS — F332 Major depressive disorder, recurrent severe without psychotic features: Secondary | ICD-10-CM | POA: Diagnosis not present

## 2022-01-30 DIAGNOSIS — G43109 Migraine with aura, not intractable, without status migrainosus: Secondary | ICD-10-CM | POA: Diagnosis not present

## 2022-01-30 DIAGNOSIS — I10 Essential (primary) hypertension: Secondary | ICD-10-CM | POA: Diagnosis not present

## 2022-01-30 DIAGNOSIS — Z982 Presence of cerebrospinal fluid drainage device: Secondary | ICD-10-CM | POA: Diagnosis not present

## 2022-01-30 DIAGNOSIS — R110 Nausea: Secondary | ICD-10-CM | POA: Diagnosis not present

## 2022-05-24 DIAGNOSIS — M7541 Impingement syndrome of right shoulder: Secondary | ICD-10-CM | POA: Diagnosis not present

## 2022-05-24 DIAGNOSIS — R52 Pain, unspecified: Secondary | ICD-10-CM | POA: Diagnosis not present

## 2022-06-26 DIAGNOSIS — F332 Major depressive disorder, recurrent severe without psychotic features: Secondary | ICD-10-CM | POA: Diagnosis not present

## 2022-06-28 DIAGNOSIS — Z13220 Encounter for screening for lipoid disorders: Secondary | ICD-10-CM | POA: Diagnosis not present

## 2022-06-28 DIAGNOSIS — E291 Testicular hypofunction: Secondary | ICD-10-CM | POA: Diagnosis not present

## 2022-06-28 DIAGNOSIS — Z0000 Encounter for general adult medical examination without abnormal findings: Secondary | ICD-10-CM | POA: Diagnosis not present

## 2022-06-28 DIAGNOSIS — R7301 Impaired fasting glucose: Secondary | ICD-10-CM | POA: Diagnosis not present

## 2022-06-28 DIAGNOSIS — Z125 Encounter for screening for malignant neoplasm of prostate: Secondary | ICD-10-CM | POA: Diagnosis not present

## 2022-07-02 DIAGNOSIS — E291 Testicular hypofunction: Secondary | ICD-10-CM | POA: Diagnosis not present

## 2022-07-02 DIAGNOSIS — Z23 Encounter for immunization: Secondary | ICD-10-CM | POA: Diagnosis not present

## 2022-07-02 DIAGNOSIS — R7301 Impaired fasting glucose: Secondary | ICD-10-CM | POA: Diagnosis not present

## 2022-07-02 DIAGNOSIS — I10 Essential (primary) hypertension: Secondary | ICD-10-CM | POA: Diagnosis not present

## 2022-07-02 DIAGNOSIS — E782 Mixed hyperlipidemia: Secondary | ICD-10-CM | POA: Diagnosis not present

## 2022-07-02 DIAGNOSIS — Z0000 Encounter for general adult medical examination without abnormal findings: Secondary | ICD-10-CM | POA: Diagnosis not present

## 2022-07-24 DIAGNOSIS — F332 Major depressive disorder, recurrent severe without psychotic features: Secondary | ICD-10-CM | POA: Diagnosis not present

## 2022-08-26 DIAGNOSIS — R051 Acute cough: Secondary | ICD-10-CM | POA: Diagnosis not present

## 2022-08-26 DIAGNOSIS — R918 Other nonspecific abnormal finding of lung field: Secondary | ICD-10-CM | POA: Diagnosis not present

## 2022-08-26 DIAGNOSIS — J189 Pneumonia, unspecified organism: Secondary | ICD-10-CM | POA: Diagnosis not present

## 2022-08-26 DIAGNOSIS — M791 Myalgia, unspecified site: Secondary | ICD-10-CM | POA: Diagnosis not present

## 2022-08-29 DIAGNOSIS — G4733 Obstructive sleep apnea (adult) (pediatric): Secondary | ICD-10-CM | POA: Diagnosis not present

## 2022-09-06 DIAGNOSIS — R059 Cough, unspecified: Secondary | ICD-10-CM | POA: Diagnosis not present

## 2022-09-06 DIAGNOSIS — J189 Pneumonia, unspecified organism: Secondary | ICD-10-CM | POA: Diagnosis not present

## 2022-09-06 DIAGNOSIS — R972 Elevated prostate specific antigen [PSA]: Secondary | ICD-10-CM | POA: Diagnosis not present

## 2022-09-06 DIAGNOSIS — I10 Essential (primary) hypertension: Secondary | ICD-10-CM | POA: Diagnosis not present

## 2022-09-28 DIAGNOSIS — G4733 Obstructive sleep apnea (adult) (pediatric): Secondary | ICD-10-CM | POA: Diagnosis not present

## 2022-10-09 DIAGNOSIS — F332 Major depressive disorder, recurrent severe without psychotic features: Secondary | ICD-10-CM | POA: Diagnosis not present
